# Patient Record
Sex: Male | Born: 1985 | Race: White | Hispanic: No | Marital: Married | Smoking: Current every day smoker
Health system: Southern US, Community
[De-identification: ages and names within clinical notes are randomized; demographics above are authoritative.]

## PROBLEM LIST (undated history)

## (undated) DIAGNOSIS — I10 Essential (primary) hypertension: Secondary | ICD-10-CM

## (undated) DIAGNOSIS — N200 Calculus of kidney: Secondary | ICD-10-CM

## (undated) DIAGNOSIS — E7800 Pure hypercholesterolemia, unspecified: Secondary | ICD-10-CM

---

## 2015-07-04 ENCOUNTER — Ambulatory Visit
Admission: EM | Admit: 2015-07-04 | Discharge: 2015-07-04 | Disposition: A | Discharge status: Home / Self Care | Payer: BLUE CROSS/BLUE SHIELD | Attending: Family Medicine | Admitting: Family Medicine

## 2015-07-04 ENCOUNTER — Ambulatory Visit: Payer: BLUE CROSS/BLUE SHIELD

## 2015-07-04 ENCOUNTER — Encounter: Payer: Self-pay | Admitting: Emergency Medicine

## 2015-07-04 DIAGNOSIS — S161XXA Strain of muscle, fascia and tendon at neck level, initial encounter: Secondary | ICD-10-CM

## 2015-07-04 DIAGNOSIS — T148XXA Other injury of unspecified body region, initial encounter: Secondary | ICD-10-CM

## 2015-07-04 MED ORDER — CYCLOBENZAPRINE HCL 10 MG PO TABS: 10.0000 mg | ORAL_TABLET | Freq: Three times a day (TID) | ORAL | Status: AC | PRN

## 2015-07-04 MED ORDER — IBUPROFEN 800 MG PO TABS: 800.0000 mg | ORAL_TABLET | Freq: Three times a day (TID) | ORAL | Status: AC | PRN

## 2015-07-04 MED ORDER — KETOROLAC TROMETHAMINE 60 MG/2ML IM SOLN
60.0000 mg | Freq: Once | INTRAMUSCULAR | Status: AC
  Administered 2015-07-04: 60 mg via INTRAMUSCULAR

## 2015-07-04 NOTE — Discharge Instructions (Signed)
Cervical Strain With Rehab Cervical strain and sprain are injuries that commonly occur with "whiplash" injuries. Whiplash occurs when the neck is forcefully whipped backward or forward, such as during a motor vehicle accident or during contact sports. The muscles, ligaments, tendons, discs, and nerves of the neck are susceptible to injury when this occurs. RISK FACTORS Risk of having a whiplash injury increases if:  Osteoarthritis of the spine.  Situations that make head or neck accidents or trauma more likely.  High-risk sports (football, rugby, wrestling, hockey, auto racing, gymnastics, diving, contact karate, or boxing).  Poor strength and flexibility of the neck.  Previous neck injury.  Poor tackling technique.  Improperly fitted or padded equipment. SYMPTOMS   Pain or stiffness in the front or back of neck or both.  Symptoms may present immediately or up to 24 hours after injury.  Dizziness, headache, nausea, and vomiting.  Muscle spasm with soreness and stiffness in the neck.  Tenderness and swelling at the injury site. PREVENTION  Learn and use proper technique (avoid tackling with the head, spearing, and head-butting; use proper falling techniques to avoid landing on the head).  Warm up and stretch properly before activity.  Maintain physical fitness:  Strength, flexibility, and endurance.  Cardiovascular fitness.  Wear properly fitted and padded protective equipment, such as padded soft collars, for participation in contact sports. PROGNOSIS  Recovery from cervical strain and sprain injuries is dependent on the extent of the injury. These injuries are usually curable in 1 week to 3 months with appropriate treatment.  RELATED COMPLICATIONS   Temporary numbness and weakness may occur if the nerve roots are damaged, and this may persist until the nerve has completely healed.  Chronic pain due to frequent recurrence of symptoms.  Prolonged healing, especially  if activity is resumed too soon (before complete recovery). TREATMENT  Treatment initially involves the use of ice and medication to help reduce pain and inflammation. It is also important to perform strengthening and stretching exercises and modify activities that worsen symptoms so the injury does not get worse. These exercises may be performed at home or with a therapist. For patients who experience severe symptoms, a soft, padded collar may be recommended to be worn around the neck.  Improving your posture may help reduce symptoms. Posture improvement includes pulling your chin and abdomen in while sitting or standing. If you are sitting, sit in a firm chair with your buttocks against the back of the chair. While sleeping, try replacing your pillow with a small towel rolled to 2 inches in diameter, or use a cervical pillow or soft cervical collar. Poor sleeping positions delay healing.  For patients with nerve root damage, which causes numbness or weakness, the use of a cervical traction apparatus may be recommended. Surgery is rarely necessary for these injuries. However, cervical strain and sprains that are present at birth (congenital) may require surgery. MEDICATION   If pain medication is necessary, nonsteroidal anti-inflammatory medications, such as aspirin and ibuprofen, or other minor pain relievers, such as acetaminophen, are often recommended.  Do not take pain medication for 7 days before surgery.  Prescription pain relievers may be given if deemed necessary by your caregiver. Use only as directed and only as much as you need. HEAT AND COLD:   Cold treatment (icing) relieves pain and reduces inflammation. Cold treatment should be applied for 10 to 15 minutes every 2 to 3 hours for inflammation and pain and immediately after any activity that aggravates your symptoms. Use  ice packs or an ice massage.  Heat treatment may be used prior to performing the stretching and strengthening  activities prescribed by your caregiver, physical therapist, or athletic trainer. Use a heat pack or a warm soak. SEEK MEDICAL CARE IF:   Symptoms get worse or do not improve in 2 weeks despite treatment.  New, unexplained symptoms develop (drugs used in treatment may produce side effects). EXERCISES RANGE OF MOTION (ROM) AND STRETCHING EXERCISES - Cervical Strain and Sprain These exercises may help you when beginning to rehabilitate your injury. In order to successfully resolve your symptoms, you must improve your posture. These exercises are designed to help reduce the forward-head and rounded-shoulder posture which contributes to this condition. Your symptoms may resolve with or without further involvement from your physician, physical therapist or athletic trainer. While completing these exercises, remember:   Restoring tissue flexibility helps normal motion to return to the joints. This allows healthier, less painful movement and activity.  An effective stretch should be held for at least 20 seconds, although you may need to begin with shorter hold times for comfort.  A stretch should never be painful. You should only feel a gentle lengthening or release in the stretched tissue. STRETCH- Axial Extensors  Lie on your back on the floor. You may bend your knees for comfort. Place a rolled-up hand towel or dish towel, about 2 inches in diameter, under the part of your head that makes contact with the floor.  Gently tuck your chin, as if trying to make a "double chin," until you feel a gentle stretch at the base of your head.  Hold __________ seconds. Repeat __________ times. Complete this exercise __________ times per day.  STRETCH - Axial Extension   Stand or sit on a firm surface. Assume a good posture: chest up, shoulders drawn back, abdominal muscles slightly tense, knees unlocked (if standing) and feet hip width apart.  Slowly retract your chin so your head slides back and your chin  slightly lowers. Continue to look straight ahead.  You should feel a gentle stretch in the back of your head. Be certain not to feel an aggressive stretch since this can cause headaches later.  Hold for __________ seconds. Repeat __________ times. Complete this exercise __________ times per day. STRETCH - Cervical Side Bend   Stand or sit on a firm surface. Assume a good posture: chest up, shoulders drawn back, abdominal muscles slightly tense, knees unlocked (if standing) and feet hip width apart.  Without letting your nose or shoulders move, slowly tip your right / left ear to your shoulder until your feel a gentle stretch in the muscles on the opposite side of your neck.  Hold __________ seconds. Repeat __________ times. Complete this exercise __________ times per day. STRETCH - Cervical Rotators   Stand or sit on a firm surface. Assume a good posture: chest up, shoulders drawn back, abdominal muscles slightly tense, knees unlocked (if standing) and feet hip width apart.  Keeping your eyes level with the ground, slowly turn your head until you feel a gentle stretch along the back and opposite side of your neck.  Hold __________ seconds. Repeat __________ times. Complete this exercise __________ times per day. RANGE OF MOTION - Neck Circles   Stand or sit on a firm surface. Assume a good posture: chest up, shoulders drawn back, abdominal muscles slightly tense, knees unlocked (if standing) and feet hip width apart.  Gently roll your head down and around from the back of one  shoulder to the back of the other. The motion should never be forced or painful.  Repeat the motion 10-20 times, or until you feel the neck muscles relax and loosen. Repeat __________ times. Complete the exercise __________ times per day. STRENGTHENING EXERCISES - Cervical Strain and Sprain These exercises may help you when beginning to rehabilitate your injury. They may resolve your symptoms with or without  further involvement from your physician, physical therapist, or athletic trainer. While completing these exercises, remember:   Muscles can gain both the endurance and the strength needed for everyday activities through controlled exercises.  Complete these exercises as instructed by your physician, physical therapist, or athletic trainer. Progress the resistance and repetitions only as guided.  You may experience muscle soreness or fatigue, but the pain or discomfort you are trying to eliminate should never worsen during these exercises. If this pain does worsen, stop and make certain you are following the directions exactly. If the pain is still present after adjustments, discontinue the exercise until you can discuss the trouble with your clinician. STRENGTH - Cervical Flexors, Isometric  Face a wall, standing about 6 inches away. Place a small pillow, a ball about 6-8 inches in diameter, or a folded towel between your forehead and the wall.  Slightly tuck your chin and gently push your forehead into the soft object. Push only with mild to moderate intensity, building up tension gradually. Keep your jaw and forehead relaxed.  Hold 10 to 20 seconds. Keep your breathing relaxed.  Release the tension slowly. Relax your neck muscles completely before you start the next repetition. Repeat __________ times. Complete this exercise __________ times per day. STRENGTH- Cervical Lateral Flexors, Isometric   Stand about 6 inches away from a wall. Place a small pillow, a ball about 6-8 inches in diameter, or a folded towel between the side of your head and the wall.  Slightly tuck your chin and gently tilt your head into the soft object. Push only with mild to moderate intensity, building up tension gradually. Keep your jaw and forehead relaxed.  Hold 10 to 20 seconds. Keep your breathing relaxed.  Release the tension slowly. Relax your neck muscles completely before you start the next  repetition. Repeat __________ times. Complete this exercise __________ times per day. STRENGTH - Cervical Extensors, Isometric   Stand about 6 inches away from a wall. Place a small pillow, a ball about 6-8 inches in diameter, or a folded towel between the back of your head and the wall.  Slightly tuck your chin and gently tilt your head back into the soft object. Push only with mild to moderate intensity, building up tension gradually. Keep your jaw and forehead relaxed.  Hold 10 to 20 seconds. Keep your breathing relaxed.  Release the tension slowly. Relax your neck muscles completely before you start the next repetition. Repeat __________ times. Complete this exercise __________ times per day. POSTURE AND BODY MECHANICS CONSIDERATIONS - Cervical Strain and Sprain Keeping correct posture when sitting, standing or completing your activities will reduce the stress put on different body tissues, allowing injured tissues a chance to heal and limiting painful experiences. The following are general guidelines for improved posture. Your physician or physical therapist will provide you with any instructions specific to your needs. While reading these guidelines, remember:  The exercises prescribed by your provider will help you have the flexibility and strength to maintain correct postures.  The correct posture provides the optimal environment for your joints to work. All of  your joints have less wear and tear when properly supported by a spine with good posture. This means you will experience a healthier, less painful body.  Correct posture must be practiced with all of your activities, especially prolonged sitting and standing. Correct posture is as important when doing repetitive low-stress activities (typing) as it is when doing a single heavy-load activity (lifting). PROLONGED STANDING WHILE SLIGHTLY LEANING FORWARD When completing a task that requires you to lean forward while standing in one  place for a long time, place either foot up on a stationary 2- to 4-inch high object to help maintain the best posture. When both feet are on the ground, the low back tends to lose its slight inward curve. If this curve flattens (or becomes too large), then the back and your other joints will experience too much stress, fatigue more quickly, and can cause pain.  RESTING POSITIONS Consider which positions are most painful for you when choosing a resting position. If you have pain with flexion-based activities (sitting, bending, stooping, squatting), choose a position that allows you to rest in a less flexed posture. You would want to avoid curling into a fetal position on your side. If your pain worsens with extension-based activities (prolonged standing, working overhead), avoid resting in an extended position such as sleeping on your stomach. Most people will find more comfort when they rest with their spine in a more neutral position, neither too rounded nor too arched. Lying on a non-sagging bed on your side with a pillow between your knees, or on your back with a pillow under your knees will often provide some relief. Keep in mind, being in any one position for a prolonged period of time, no matter how correct your posture, can still lead to stiffness. WALKING Walk with an upright posture. Your ears, shoulders, and hips should all line up. OFFICE WORK When working at a desk, create an environment that supports good, upright posture. Without extra support, muscles fatigue and lead to excessive strain on joints and other tissues. CHAIR:  A chair should be able to slide under your desk when your back makes contact with the back of the chair. This allows you to work closely.  The chair's height should allow your eyes to be level with the upper part of your monitor and your hands to be slightly lower than your elbows.  Body position:  Your feet should make contact with the floor. If this is not  possible, use a foot rest.  Keep your ears over your shoulders. This will reduce stress on your neck and low back.   This information is not intended to replace advice given to you by your health care provider. Make sure you discuss any questions you have with your health care provider.   Document Released: 09/07/2005 Document Revised: 09/28/2014 Document Reviewed: 12/20/2008 Elsevier Interactive Patient Education [employer]

## 2015-07-04 NOTE — ED Provider Notes (Signed)
[facility] Emergency Department Provider Note  ____________________________________________  Time seen: Approximately 11:29 AM  I have reviewed the triage vital signs and the nursing notes.   HISTORY  Chief Complaint Neck Injury    HPI Curley SpiceStephen Fidler is a 29 y.o. male presents for complaint of neck and bilateral shoulder pain 3 days. Patient reports that he frequently has tension in his upper back and neck when he bends over for too long. 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 States he has some pain in these areas often when looking down longer periods. Patient reports that 3 days ago he already had some soreness in his back and neck but states that he was playing the guitar and looking down at music as well as the guitar. Patient states that he was looking down for at least 30 minutes. Patient states that he then looked up and to the side and had increased pain. Patient states that he has felt intermittent muscle spasms to bilateral shoulders since, aggravated by movement. States he was able to pin point pain onset to same time as position change.   States that the pain is primarily with movement but does continue with an intermittent ache and muscular tightness feeling in the upper shoulder blades and neck. States majority of pain is with movement. Denies pain radiation. Denies numbness or tingling sensations. States the pain does not radiate into his arms. Denies chest pain. Denies difficulty grasping things of hands or movement of arms. Denies fall or injury. Denies trauma.  Patient states that current pain is 3 out of 10, a report 7 out of 10 with movement. States right shoulder area is worse. Denies chronic neck or back pain. Denies other medical history.  Denies recent cough, congestion, fever. Denies headache, vision changes, sore throat, chest pain or shortness of breath.    History  reviewed. No pertinent past medical history.  There are no active problems to display for this patient.   History reviewed. No pertinent past surgical history.  Current Outpatient Rx  Name  Route  Sig  Dispense  Refill  .           Marland Kitchen.             Allergies Review of patient's allergies indicates no known allergies.  History reviewed. No pertinent family history.  Social History Social History  Substance Use Topics  . Smoking status: Never Smoker   . Smokeless tobacco: None  . Alcohol Use: Yes    Review of Systems Constitutional: No fever/chills Eyes: No visual changes. ENT: No sore throat. Cardiovascular: Denies chest pain. Respiratory: Denies shortness of breath. Gastrointestinal: No abdominal pain.  No nausea, no vomiting.  No diarrhea.  No constipation. Genitourinary: Negative for dysuria. Musculoskeletal: Negative for back pain. Positive for neck and shoulder pain as above.  Skin: Negative for rash. Neurological: Negative for headaches, focal weakness or numbness.  10-point ROS otherwise negative.  ____________________________________________   PHYSICAL EXAM:  VITAL SIGNS: ED Triage Vitals  Enc Vitals Group     BP 07/04/15 1007 142/85 mmHg     Pulse Rate 07/04/15 1007 64     Resp 07/04/15 1007 20     Temp 07/04/15 1007 98 F (36.7 C)     Temp Source 07/04/15 1007 Tympanic     SpO2 07/04/15 1007 99 %     Weight 07/04/15 1007 262 lb (118.842 kg)  Height 07/04/15 1007  (1.854 m)     Head Cir --      Peak Flow --      Pain Score 07/04/15 1008 7     Pain Loc --      Pain Edu? --      Excl. in GC? --     Constitutional: Alert and oriented. Well appearing and in no acute distress. Eyes: Conjunctivae are normal. PERRL. EOMI. Head: Atraumatic. Nontender, no swelling, no erythema.   Ears: no erythema, normal TMs bilaterally.   Nose: No congestion/rhinnorhea.  Mouth/Throat: Mucous membranes are moist.  Oropharynx non-erythematous. Neck: No  stridor.  Hematological/Lymphatic/Immunilogical: No cervical lymphadenopathy. Cardiovascular: Normal rate, regular rhythm. Grossly normal heart sounds.  Good peripheral circulation. Respiratory: Normal respiratory effort.  No retractions. Lungs CTAB. Gastrointestinal: Soft and nontender. No distention. Normal Bowel sounds. No CVA tenderness. Musculoskeletal: No lower or upper extremity tenderness nor edema.  No joint effusions. Bilateral pedal pulses equal and easily palpated. No thoracic or lumbar TTP.  Except: mild midline cervical TTP, mild to mod TTP to left trapezius, and mod TTP to right trapezius with muscle tension palpable. Full ROM, no pain with flexion and extension, pain present with left and right rotation. Right trapezius muscle spasm palpated with left and right rotation. Point muscular TTP, pain reproducible along right and left trapezius and patient reports pain by palpation is consistent with pain felt. Bilateral hand grips equal, bilateral distal radial pulses equal. Bilateral upper extremities with 5/5 strength, normal motor, and equal sensation.  Neurologic:  Normal speech and language. No gross focal neurologic deficits are appreciated. No gait instability. Skin:  Skin is warm, dry and intact. No rash noted. Psychiatric: Mood and affect are normal. Speech and behavior are normal.  ____________________________________________   LABS (all labs ordered are listed, but only abnormal results are displayed)  Labs Reviewed - No data to display  RADIOLOGY EXAM: CERVICAL SPINE 4+ VIEWS  COMPARISON: None.  FINDINGS: Frontal, lateral, open-mouth odontoid, and bilateral oblique views were obtained. There is no fracture or spondylolisthesis. Prevertebral soft tissues and predental space regions are normal. There is mild disc space narrowing at C5-6, C6-7, and C7-T1. There is no appreciable exit foraminal narrowing on the oblique views.  IMPRESSION: Mild disc space narrowing  at several levels. No appreciable exit foraminal narrowing. No fracture or spondylolisthesis.   Electronically Signed By: Bretta Bang III M.D. On: 07/04/2015 11:42  I, Renford Dills, personally viewed and evaluated these images (plain radiographs) as part of my medical decision making.   ____________________________________________   INITIAL IMPRESSION / ASSESSMENT AND PLAN / ED COURSE  Pertinent labs & imaging results that were available during my care of the patient were reviewed by me and considered in my medical decision making (see chart for details).  Very well appearing. No acute distress. Presents for neck and shoulder discomfort. Denies fall, trauma, head injury. States occasional has some pain in similar areas after reading or looking at computer too long, but pain increased 3 days ago bending neck down for extended time period looking at music and guitar.. States intermittent muscle spasms and constant tightness since. Denies pain radiation, chest pain, numbness, or tingling sensations. Neuromuscular intact. No focal neurological deficits.  Mild midline cervical tenderness to palpation. Moderate left and right trapezius tenderness. Right trapezius with positive palpated muscle spasm. Full range of motion movement. Pain primarily with left and right rotation. No swelling orecchymosis. Pain fully reproducible. Suspect muscular strain injury without radicular effects.  Will evaluate cervical x-ray. IM Toradol 60 mg 1.  Cervical x-ray reviewed. Per radiologist mild disc space narrowing at several levels. No appreciable exit foraminal narrowing. No fracture or spondylolisthesis.  Patient reports improved post Toradol. Discussed cervical spine x-ray results as well as given x-ray or copy to patient. Counseled regarding proper body mechanics and posture. Will treat with oral ibuprofen as well as when necessary Flexeril and supportive measures including alternate between heat and  ice.Discussed follow up with Primary care physician this week. Discussed follow up and return parameters including no resolution or any worsening concerns. Patient verbalized understanding and agreed to plan.   ____________________________________________   FINAL CLINICAL IMPRESSION(S) / ED DIAGNOSES  Final diagnoses:  Cervical strain, acute, initial encounter  Muscle strain       Renford Dills, NP 07/04/15 1322  Renford Dills, NP 07/04/15 1330

## 2015-07-04 NOTE — ED Notes (Signed)
Pt with neck and upper back pain x 3 days

## 2017-01-24 ENCOUNTER — Ambulatory Visit
Admission: EM | Admit: 2017-01-24 | Discharge: 2017-01-24 | Disposition: A | Discharge status: Home / Self Care | Payer: 59 | Attending: Family Medicine | Admitting: Family Medicine

## 2017-01-24 ENCOUNTER — Ambulatory Visit (INDEPENDENT_AMBULATORY_CARE_PROVIDER_SITE_OTHER): Payer: 59

## 2017-01-24 DIAGNOSIS — M25562 Pain in left knee: Secondary | ICD-10-CM | POA: Diagnosis not present

## 2017-01-24 DIAGNOSIS — M79672 Pain in left foot: Secondary | ICD-10-CM

## 2017-01-24 DIAGNOSIS — G8929 Other chronic pain: Secondary | ICD-10-CM

## 2017-01-24 HISTORY — DX: Essential (primary) hypertension: I10

## 2017-01-24 HISTORY — DX: Calculus of kidney: N20.0

## 2017-01-24 HISTORY — DX: Pure hypercholesterolemia, unspecified: E78.00

## 2017-01-24 MED ORDER — ORPHENADRINE CITRATE ER 100 MG PO TB12: 100.0000 mg | ORAL_TABLET | Freq: Two times a day (BID) | ORAL | 0 refills | Status: AC

## 2017-01-24 MED ORDER — MELOXICAM 15 MG PO TABS: 15.0000 mg | ORAL_TABLET | Freq: Every day | ORAL | 0 refills | Status: AC

## 2017-01-24 NOTE — ED Provider Notes (Signed)
[facility]    CSN: [id number] Arrival date & time: 01/24/17  1434     History   Chief Complaint Chief Complaint  Patient presents with  . Knee Pain  . Foot Pain    HPI Kevin Fischer is a 31 y.o. male.   Patient's here because of left knee pain and left foot pain. He states he's had trouble with his right knee for a number of years lately he started having trouble with his left knee. States was also having troubles right foot but he is wearing a orthotic device in the right foot which has helped that. Back in January he started having pain in his left knee and now his nose last month pain in the left foot. The pain in the left foot has gotten to the point where he is having difficulty ambulating. He states she's taken Motrin was not helping. No family history of joint and knee problems. States he had this stent put in for his kidneys when he was a small child for kidney stones but otherwise no surgeries or operations. Just taking anti-inflammatories over-the-counter but no pain medications. No known drug allergies. He has started smoking and smokes a few cigarettes a day. He was warned he needs to stop. Medical problems include hyper lipidemia hypertension and history kidney stones.   The history is provided by the patient. No language interpreter was used.  Knee Pain  Location:  Knee and foot Injury: no   Knee location:  L knee Foot location:  L foot Pain details:    Quality:  Throbbing and sharp   Radiates to:  Does not radiate   Severity:  Moderate   Onset quality:  Unable to specify   Timing:  Constant   Progression:  Worsening Chronicity:  New Dislocation: no   Foreign body present:  No foreign bodies Worsened by:  Bearing weight Foot Pain  This is a new problem. The current episode started more than 2 days ago. The problem occurs constantly. Pertinent negatives include no chest pain, no headaches and no shortness of breath. He has tried nothing for the  symptoms. The treatment provided no relief.    Past Medical History:  Diagnosis Date  . High cholesterol   . Hypertension   . Kidney stones     There are no active problems to display for this patient.   History reviewed. No pertinent surgical history.     Home Medications    Prior to Admission medications   Medication Sig Start Date End Date Taking? Authorizing Provider  amLODipine (NORVASC) 10 MG tablet Take 10 mg by mouth daily.   Yes [provider]  fenofibrate (TRICOR) 145 MG tablet Take 145 mg by mouth daily.   Yes [provider]  cyclobenzaprine (FLEXERIL) 10 MG tablet Take 1 tablet (10 mg total) by mouth every 8 (eight) hours as needed for muscle spasms (PRN pain. Do not drive or operate heavy machinery while taking as can cause drowsiness.). 07/04/15   Renford Dills, NP  ibuprofen (ADVIL,MOTRIN) 800 MG tablet Take 1 tablet (800 mg total) by mouth every 8 (eight) hours as needed for mild pain or moderate pain. 07/04/15   Renford Dills, NP  meloxicam (MOBIC) 15 MG tablet Take 1 tablet (15 mg total) by mouth daily. 01/24/17   Hassan Rowan, MD  orphenadrine (NORFLEX) 100 MG tablet Take 1 tablet (100 mg total) by mouth 2 (two) times daily. 01/24/17   Hassan Rowan, MD    Family  History History reviewed. No pertinent family history.  Social History Social History  Substance Use Topics  . Smoking status: Current Every Day Smoker    Types: Cigarettes  . Smokeless tobacco: Never Used  . Alcohol use Yes     Comment: few drinks per day     Allergies   Patient has no known allergies.   Review of Systems Review of Systems  Respiratory: Negative for shortness of breath.   Cardiovascular: Negative for chest pain.  Musculoskeletal: Positive for arthralgias, joint swelling and myalgias.  Neurological: Negative for headaches.  All other systems reviewed and are negative.    Physical Exam Triage Vital Signs ED Triage Vitals  Enc Vitals Group      BP 01/24/17 1507 (!) 154/73     Pulse Rate 01/24/17 1507 97     Resp 01/24/17 1507 20     Temp 01/24/17 1507 98 F (36.7 C)     Temp Source 01/24/17 1507 Oral     SpO2 01/24/17 1507 100 %     Weight 01/24/17 1502 270 lb (122.5 kg)     Height --      Head Circumference --      Peak Flow --      Pain Score 01/24/17 1503 4     Pain Loc --      Pain Edu? --      Excl. in GC? --    No data found.   Updated Vital Signs BP (!) 154/73 (BP Location: Left Arm)   Pulse 97   Temp 98 F (36.7 C) (Oral)   Resp 20   Wt 270 lb (122.5 kg)   SpO2 100%   BMI 35.62 kg/m   Visual Acuity Right Eye Distance:   Left Eye Distance:   Bilateral Distance:    Right Eye Near:   Left Eye Near:    Bilateral Near:     Physical Exam  Constitutional: He is oriented to person, place, and time. He appears well-developed and well-nourished. No distress.  HENT:  Head: Normocephalic and atraumatic.  Right Ear: External ear normal.  Left Ear: External ear normal.  Eyes: Pupils are equal, round, and reactive to light.  Neck: Normal range of motion. Neck supple.  Pulmonary/Chest: Effort normal.  Musculoskeletal: Normal range of motion. He exhibits tenderness.       Left knee: He exhibits swelling, effusion and bony tenderness. He exhibits normal range of motion. Tenderness found.       Left foot: There is tenderness, bony tenderness and swelling.       Feet:  Patient is tender along the left side of her foot and area is swollen to touch as well. As far as the left knee is concerned the knee is stable is tenderness along both inferior aspects of the left knee and some swelling is present.  Neurological: He is alert and oriented to person, place, and time.  Skin: Skin is warm. He is not diaphoretic.  Psychiatric: He has a normal mood and affect.  Vitals reviewed.    UC Treatments / Results  Labs (all labs ordered are listed, but only abnormal results are displayed) Labs Reviewed - No data to  display  EKG  EKG Interpretation None       Radiology Dg Knee Complete 4 Views Left  Result Date: 01/24/2017 CLINICAL DATA:  Pain without trauma. EXAM: LEFT KNEE - COMPLETE 4+ VIEW COMPARISON:  None. FINDINGS: No evidence of fracture, dislocation, or joint effusion. No evidence  of arthropathy or other focal bone abnormality. Soft tissues are unremarkable. IMPRESSION: Negative. Electronically Signed   By: Gerome Sam III M.D   On: 01/24/2017 16:47   Dg Foot Complete Left  Result Date: 01/24/2017 CLINICAL DATA:  No known trauma. EXAM: LEFT FOOT - COMPLETE 3+ VIEW COMPARISON:  None. FINDINGS: There is no evidence of fracture or dislocation. There is no evidence of arthropathy or other focal bone abnormality. Soft tissues are unremarkable. IMPRESSION: Negative. Electronically Signed   By: Gerome Sam III M.D   On: 01/24/2017 16:45    Procedures Procedures (including critical care time)  Medications Ordered in UC Medications - No data to display   Initial Impression / Assessment and Plan / UC Course  I have reviewed the triage vital signs and the nursing notes.  Pertinent labs & imaging results that were available during my care of the patient were reviewed by me and considered in my medical decision making (see chart for details).   suspect patient may have a march fracture of the left foot. The left foot also has poor arches were x-ray left foot and the left knee. 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 We'll plan placement Mobic 15 mg 1 tablet a day.   X-ray of the right foot was negative still concerned over possible gait disturbance causing the foot pain as well as a knee pain. We'll place on Mobic as mentioned above but also going to add Norflex 100 mg twice a day for discomfort. Strong suggest finding orthopedic surgeon for evaluation of his knee and try to be careful with his gait because he did not care for state he will  fracture component is foot Final Clinical Impressions(s) / UC Diagnoses   Final diagnoses:  Chronic pain of left knee  Foot pain, left    New Prescriptions New Prescriptions   MELOXICAM (MOBIC) 15 MG TABLET    Take 1 tablet (15 mg total) by mouth daily.   ORPHENADRINE (NORFLEX) 100 MG TABLET    Take 1 tablet (100 mg total) by mouth 2 (two) times daily.          Hassan Rowan, MD 01/24/17 [id number]

## 2017-01-24 NOTE — ED Triage Notes (Addendum)
Pt states left knee and left foot pain x past few months. No known injury. Reports chronic swelling to both knee and foot. Pain 4/10 at rest but increases to 7/10 with walking. Pt has a job where he is standing all day. No know injury

## 2018-01-27 IMAGING — CR DG KNEE COMPLETE 4+V*L*
4 series · 4 of 4 positions shown · non-contrast
Comparison: None.

CLINICAL DATA: Pain without trauma.

EXAM:
LEFT KNEE - COMPLETE 4+ VIEW

[knee ap (1 of 3)]
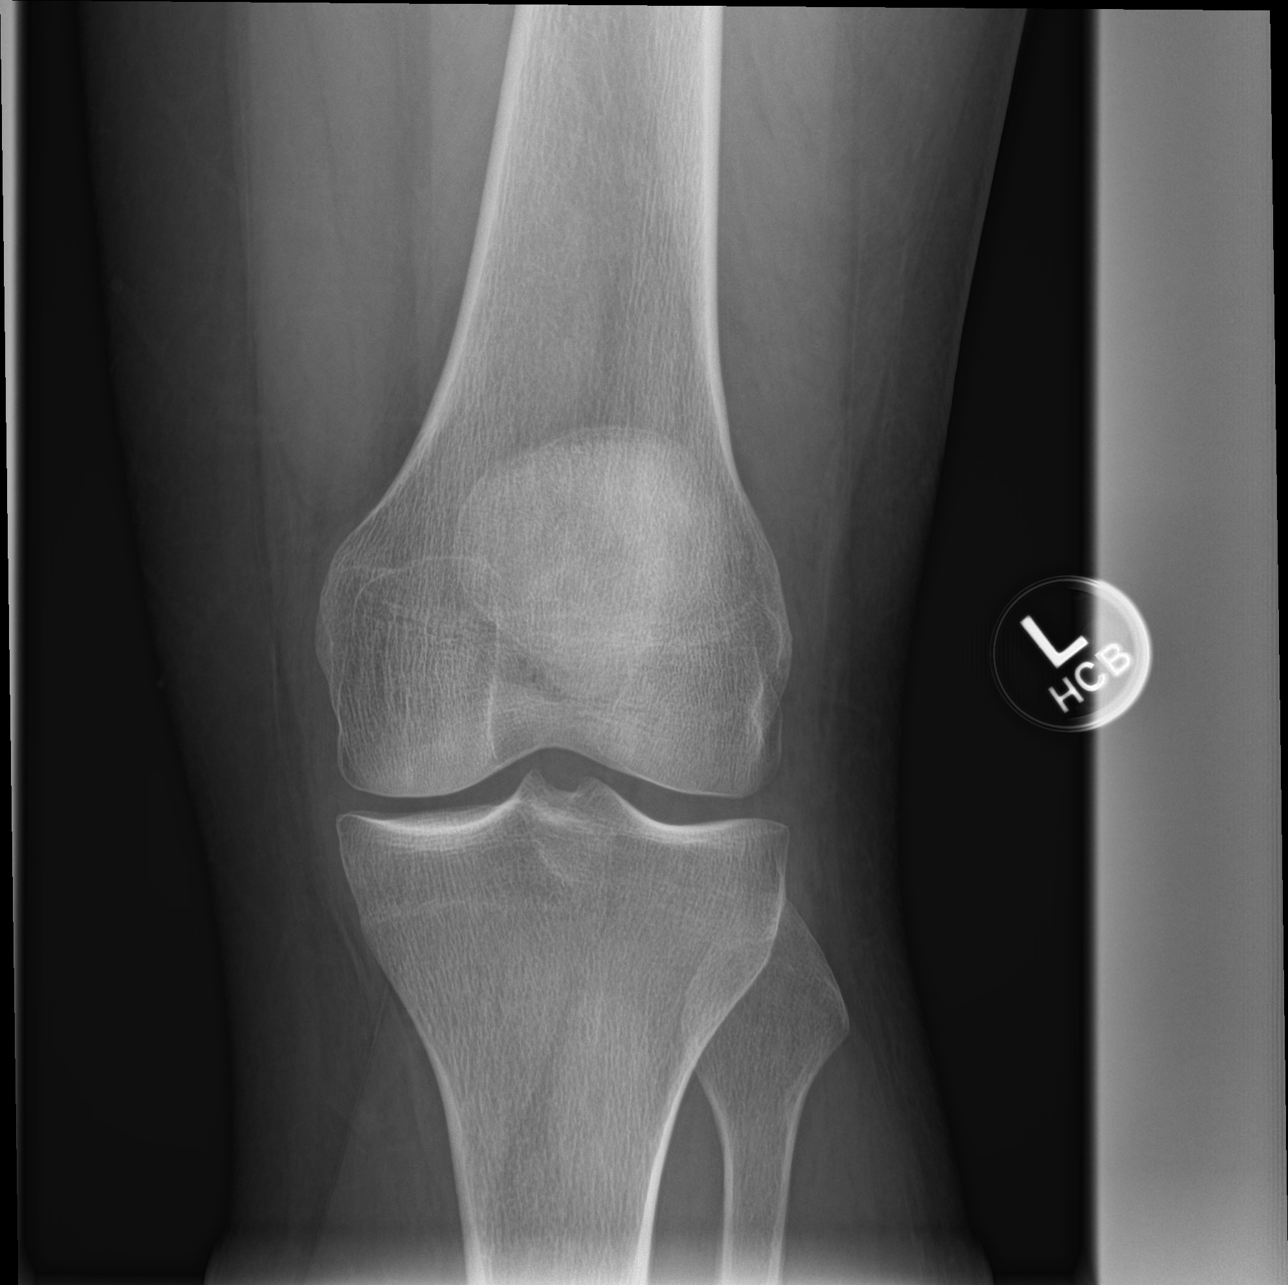

[knee ap (2 of 3)]
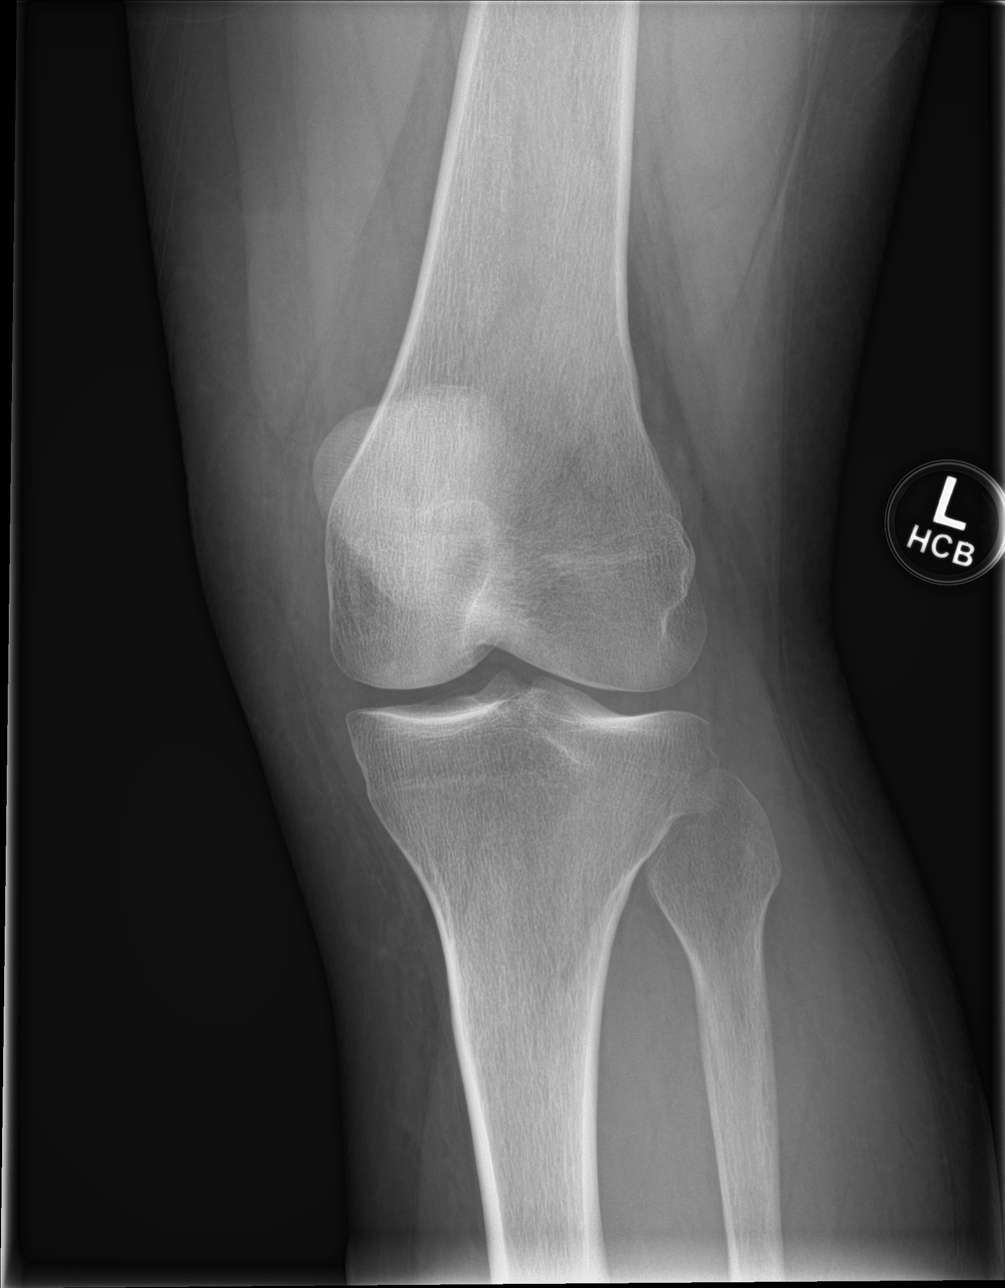

[knee ap (3 of 3)]
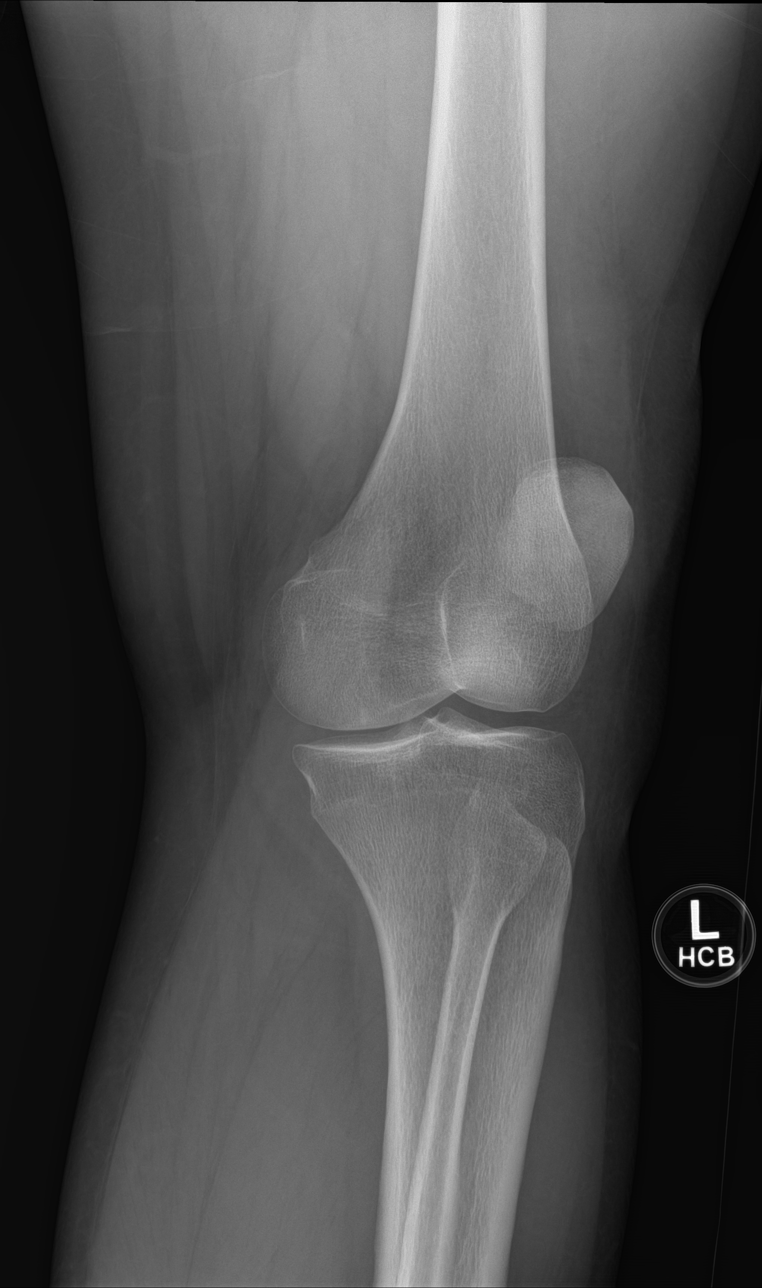

[knee lat]
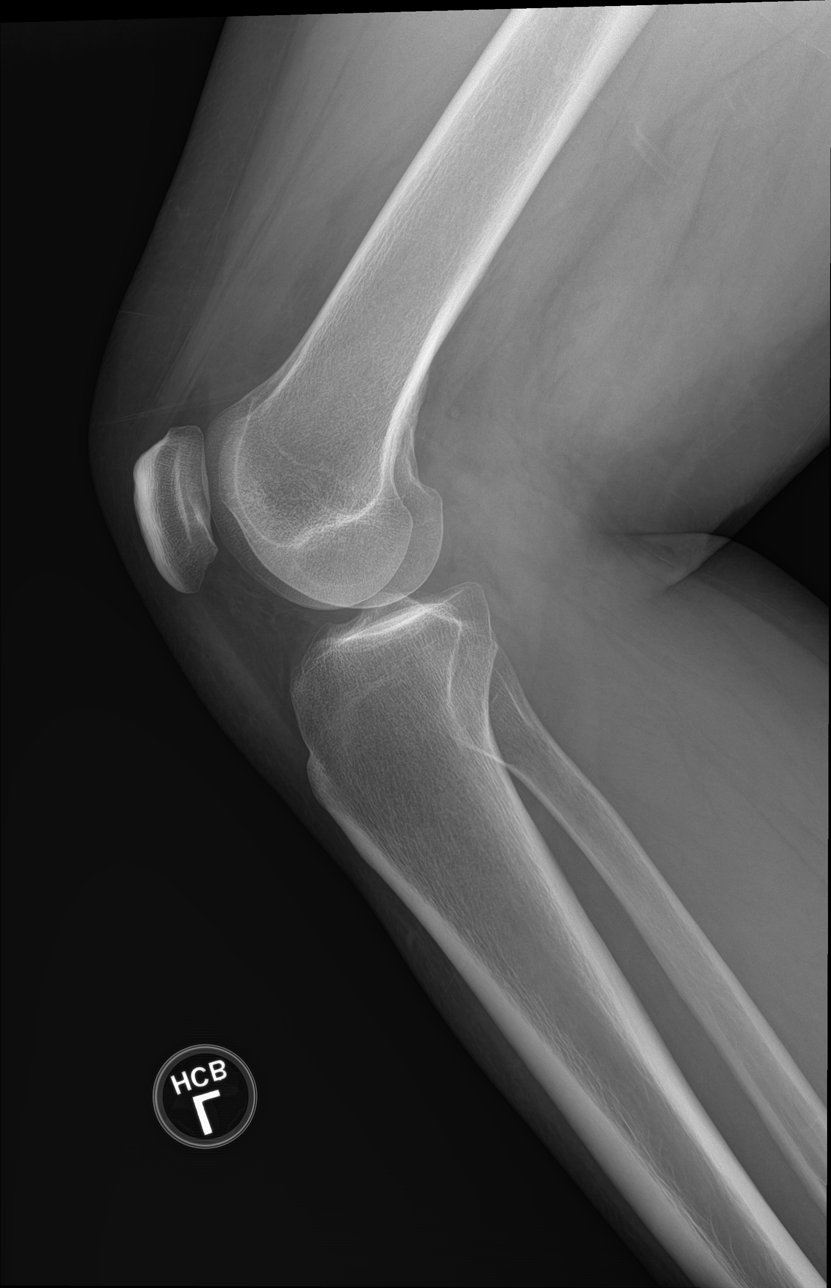

[4 of 4 positions shown; findings below may reference images not displayed]

FINDINGS: No evidence of fracture, dislocation, or joint effusion. No evidence
of arthropathy or other focal bone abnormality. Soft tissues are
unremarkable.
IMPRESSION: Negative.
